# Patient Record
Sex: Female | Born: 1995 | Race: White | Hispanic: No | Marital: Single | Smoking: Never smoker
Health system: Southern US, Community
[De-identification: ages and names within clinical notes are randomized; demographics above are authoritative.]

---
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
## 2017-12-08 IMAGING — CR DG ANKLE COMPLETE 3+V*L*
1 series · 3 of 3 positions shown · non-contrast
Comparison: None in PACs

CLINICAL DATA: Status post fall down stairs 2 months ago with
persistent lateral malleolar discomfort ; no previous injuries.

EXAM:
LEFT ANKLE COMPLETE - 3+ VIEW

[Series 1: dg ankle complete left · 0.14mm/px · 3 of 3 slices shown]
[im 1/3]
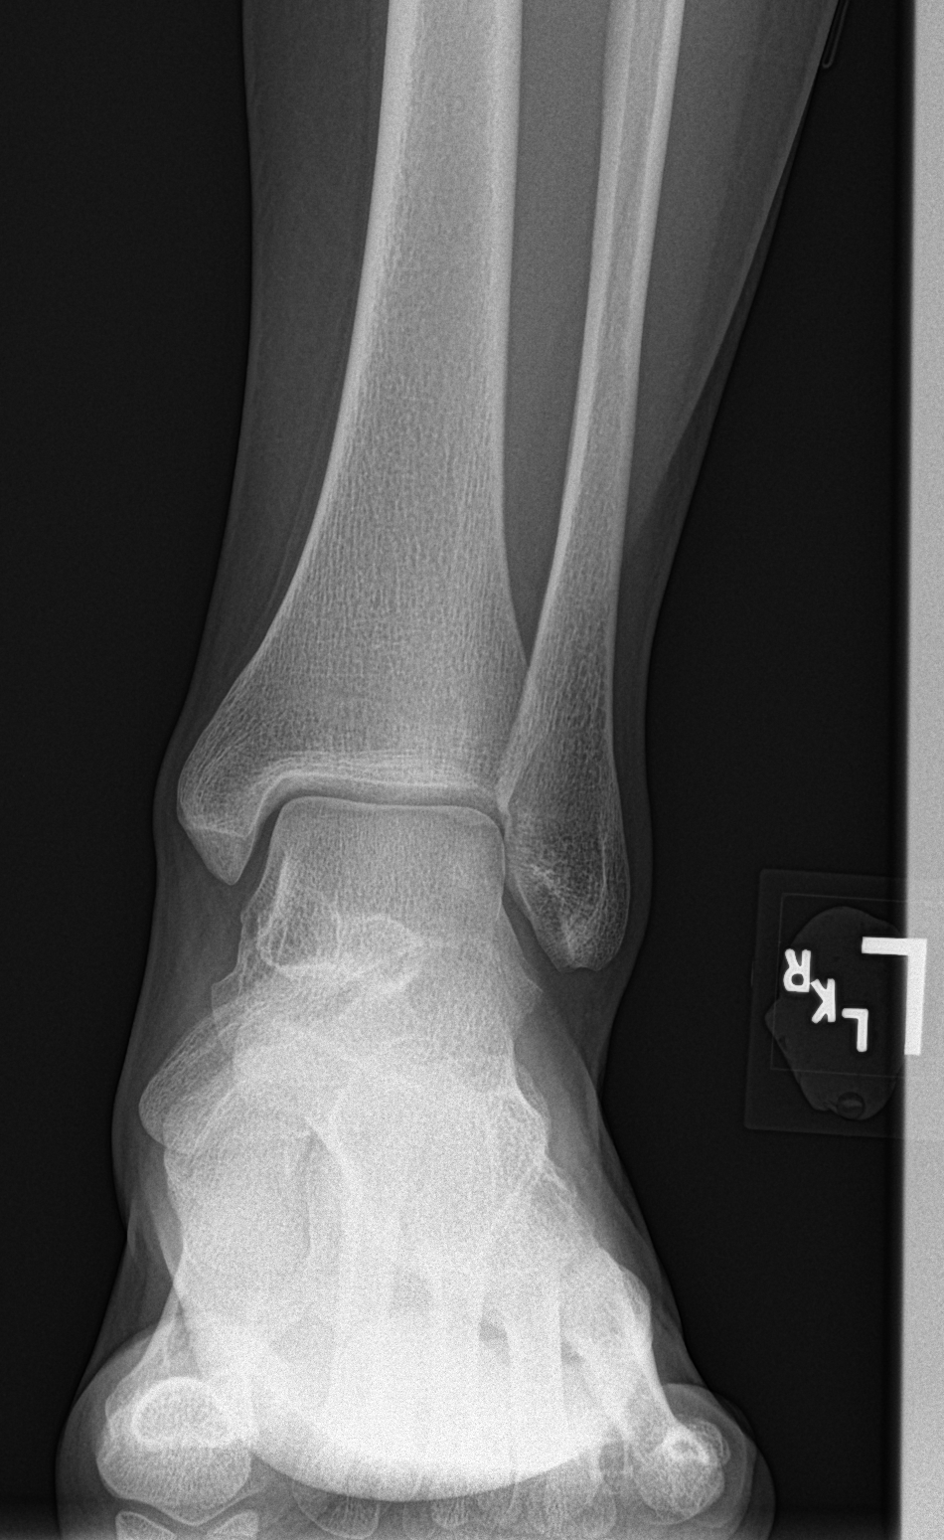
[im 2/3]
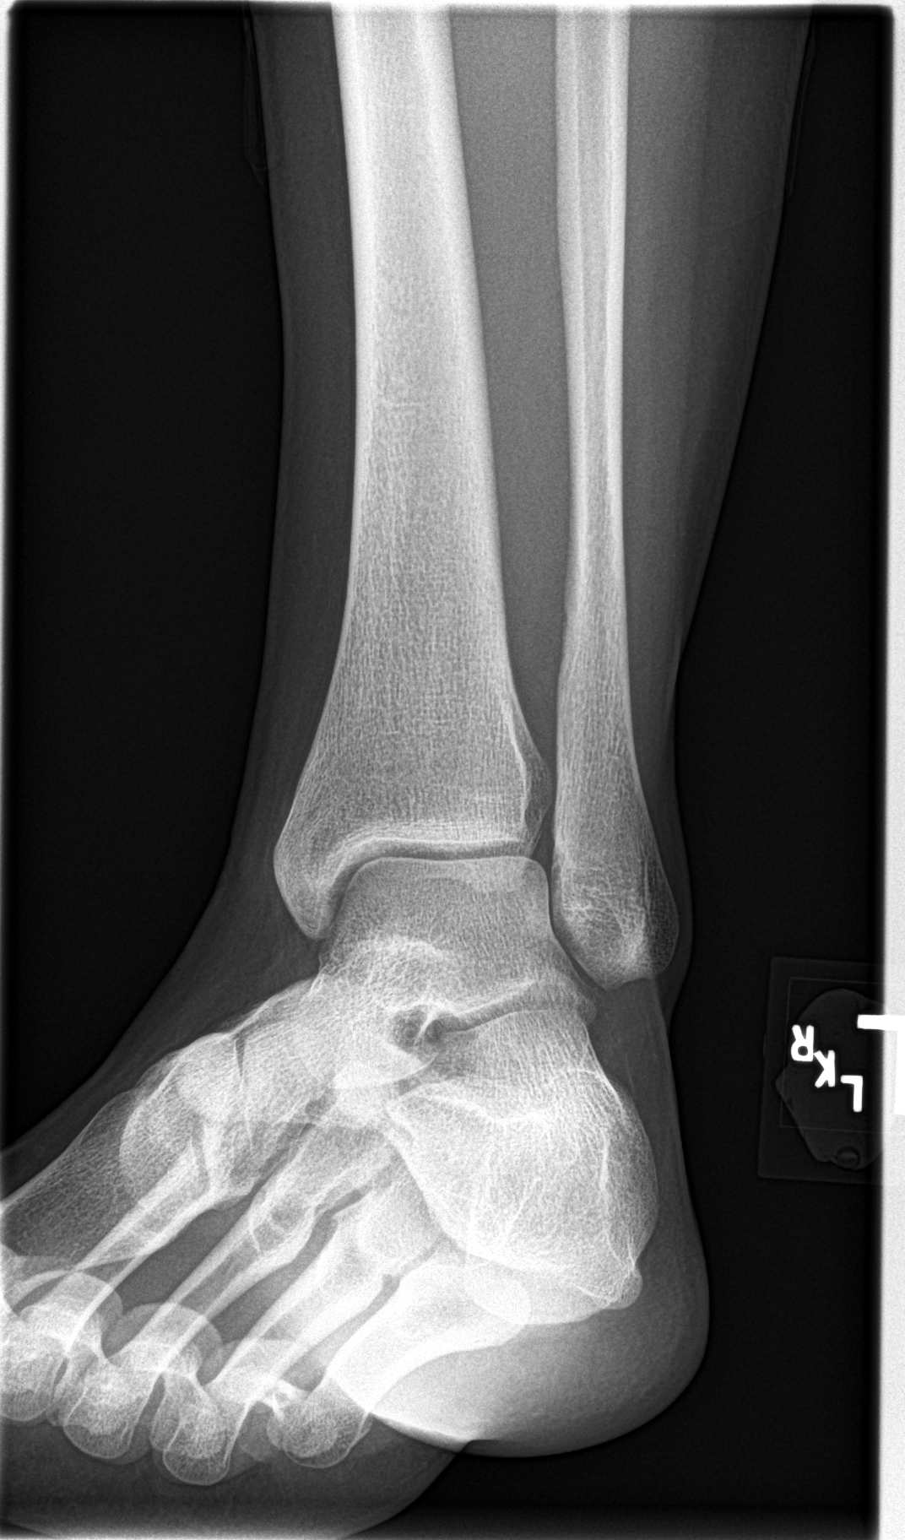
[im 3/3]
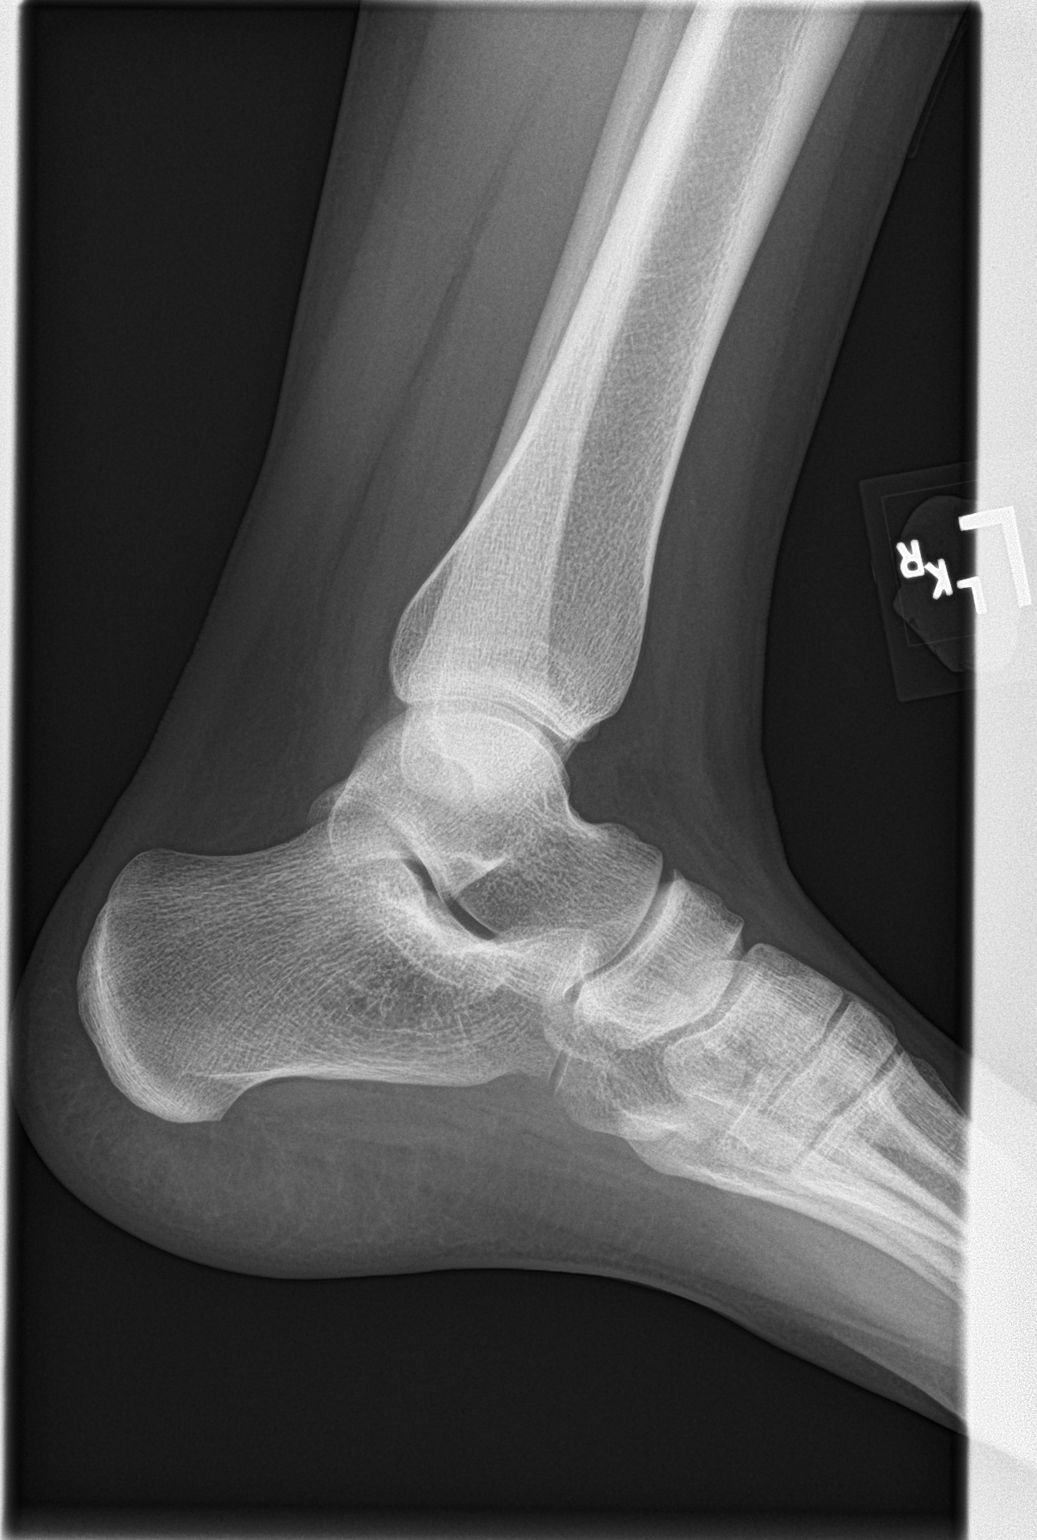

[3 of 3 positions shown; findings below may reference images not displayed]

FINDINGS: The bones of the ankle are adequately mineralized. There is no acute
or old fracture nor evidence of dislocation. There is no significant
degenerative change. The talar dome is intact. The talus and
calcaneus exhibit no acute abnormalities. The soft tissues are
unremarkable.
IMPRESSION: There is no acute or chronic bony abnormality of the left ankle. If
the patient is having significant persistent symptoms, further
evaluation with MRI would be useful to assess the patient for soft
tissue injury.

## 2018-12-21 ENCOUNTER — Other Ambulatory Visit: Payer: Self-pay | Admitting: Unknown Physician Specialty

## 2018-12-21 DIAGNOSIS — R221 Localized swelling, mass and lump, neck: Secondary | ICD-10-CM

## 2018-12-26 ENCOUNTER — Ambulatory Visit
Admission: RE | Admit: 2018-12-26 | Discharge: 2018-12-26 | Disposition: A | Discharge status: Home / Self Care | Payer: BC Managed Care – PPO | Source: Ambulatory Visit | Attending: Unknown Physician Specialty | Admitting: Unknown Physician Specialty

## 2018-12-26 ENCOUNTER — Other Ambulatory Visit: Payer: Self-pay

## 2018-12-26 DIAGNOSIS — R221 Localized swelling, mass and lump, neck: Secondary | ICD-10-CM

## 2019-03-13 ENCOUNTER — Ambulatory Visit: Payer: BC Managed Care – PPO | Admitting: Family Medicine

## 2019-03-13 ENCOUNTER — Encounter: Payer: Self-pay | Admitting: Family Medicine

## 2019-03-13 ENCOUNTER — Other Ambulatory Visit: Payer: Self-pay

## 2019-03-13 VITALS — BP 121/78 | HR 83 | Temp 97.8°F | Resp 12 | Ht 67.5 in | Wt 139.2 lb

## 2019-03-13 DIAGNOSIS — M542 Cervicalgia: Secondary | ICD-10-CM

## 2019-03-13 DIAGNOSIS — F5104 Psychophysiologic insomnia: Secondary | ICD-10-CM | POA: Diagnosis not present

## 2019-03-13 DIAGNOSIS — E039 Hypothyroidism, unspecified: Secondary | ICD-10-CM

## 2019-03-13 DIAGNOSIS — F411 Generalized anxiety disorder: Secondary | ICD-10-CM | POA: Diagnosis not present

## 2019-03-13 DIAGNOSIS — E038 Other specified hypothyroidism: Secondary | ICD-10-CM | POA: Insufficient documentation

## 2019-03-13 MED ORDER — TRAZODONE HCL 50 MG PO TABS: 25.0000 mg | ORAL_TABLET | Freq: Every evening | ORAL | 1 refills | Status: DC | PRN

## 2019-03-13 MED ORDER — FLUOXETINE HCL 20 MG PO TABS: 20.0000 mg | ORAL_TABLET | Freq: Every day | ORAL | 1 refills | Status: DC

## 2019-03-13 NOTE — Assessment & Plan Note (Signed)
Initial mildly abnormal labs in 11/2018 with normal on repeat in 01/2019. Saw endocrinology and not started on medication. Discussed that based on these labs she at one point in time met the diagnosis for subclinical hypothyroidism, however, medication is not always indicated or helpful. Given normal follow-up discussed that we could check in 3 months if symptoms (hairloss, weight gain, sluggishness) do not improve with other treatment

## 2019-03-13 NOTE — Assessment & Plan Note (Signed)
Reviewed recent normal [place]. Pt already seen ENT. Discussed trial of PT to see if symptoms improve. Referral placed

## 2019-03-13 NOTE — Assessment & Plan Note (Signed)
Discussed starting SSRI will start fluoxetine and increase. Pt does not have time to do therapy at this time. Advised mindfulness practice

## 2019-03-13 NOTE — Assessment & Plan Note (Signed)
Chronic symptoms, which have some anxiety component. Treat anxiety and then can start trazodone. Discussed risk of serotonin syndrome and what to look for with combination medication.

## 2019-03-13 NOTE — Progress Notes (Signed)
Subjective:     Gabrielle Cantrell is a 24 y.o. female presenting for Establish Care (previous PCP Dr. Gregary Signs), Neck Pain (right side x 6 months), and Anxiety     HPI  #Neck pain - has been to 3 doctors since she has been home - [place] normal - has a bump on one side of her neck which is not on the other side - sore and some pain - saw something - told it was a muscle by ENT Dr. Tami Ribas - just feels swollen and goes up to the ear and only on that side of the neck - and get some neck pain   Question about thyroid disorder Has had blood work done too Surveyor, minerals to an endocrinologist - and told that nothing was wrong Now her numbers are low  #Joint pain - has had several different joint issues - also had Raynauds phenomenon - was tested for lupus and autoimmune  - saw a specialist - in 2016 at [facility] - knee would turn red, and couldn't bend/straighten leg - difficulty finding a reason for the symptoms  Heart issues last December and wore a monitor - did have some tachycardia  #anxiety/insomnia - mom notes worse with pandemic - in master's program - not sleeping well - not worrying  - takes nyquil when she wants to sleep  - tried: melatonin and it stopped work, liquid sleep aid w/o help - symptoms since high school - stresses about everything - has always been a worrier worse over the last 3 months - little caffeine - will not fall asleep until 3-4 am - but can still function   Review of Systems  Constitutional: Positive for fatigue and unexpected weight change (weight).  Endocrine: Positive for cold intolerance.  Genitourinary:       Abnormal menses  Musculoskeletal: Positive for myalgias.       Joint pain  Psychiatric/Behavioral: Positive for sleep disturbance.     Social History   Tobacco Use  Smoking Status Never Smoker  Smokeless Tobacco Never Used        Objective:    BP Readings from Last 3 Encounters:  03/13/19 121/78   Wt Readings from Last 3  Encounters:  03/13/19 139 lb 4 oz (63.2 kg)    BP 121/78   Pulse 83   Temp 97.8 F (36.6 C)   Resp 12   Ht 5' 7.5" (1.715 m)   Wt 139 lb 4 oz (63.2 kg)   LMP 03/08/2019   SpO2 97%   BMI 21.49 kg/m    Physical Exam Constitutional:      General: She is not in acute distress.    Appearance: She is well-developed. She is not diaphoretic.  HENT:     Right Ear: External ear normal.     Left Ear: External ear normal.     Nose: Nose normal.  Eyes:     Conjunctiva/sclera: Conjunctivae normal.  Neck:     Comments: Mild fullness of the Sternocleidomastoid of the right>left Cardiovascular:     Rate and Rhythm: Normal rate.  Pulmonary:     Effort: Pulmonary effort is normal.  Musculoskeletal:     Cervical back: Normal range of motion and neck supple. No rigidity or tenderness. No pain with movement, spinous process tenderness or muscular tenderness.  Skin:    General: Skin is warm and dry.     Capillary Refill: Capillary refill takes less than 2 seconds.  Neurological:     Mental Status:  She is alert. Mental status is at baseline.  Psychiatric:        Mood and Affect: Mood is anxious. Affect is tearful.        Behavior: Behavior normal.    GAD 7 : Generalized Anxiety Score 03/13/2019  Nervous, Anxious, on Edge 3  Control/stop worrying 3  Worry too much - different things 3  Trouble relaxing 1  Restless 0  Easily annoyed or irritable 1  Afraid - awful might happen 3  Total GAD 7 Score 14  Anxiety Difficulty Somewhat difficult    Depression screen PHQ 2/9 03/13/2019  Decreased Interest 2  Down, Depressed, Hopeless 2  PHQ - 2 Score 4  Altered sleeping 3  Tired, decreased energy 3  Change in appetite 1  Feeling bad or failure about yourself  0  Trouble concentrating 0  Moving slowly or fidgety/restless 0  Suicidal thoughts 0  PHQ-9 Score 11  Difficult doing work/chores Somewhat difficult           Assessment & Plan:   Problem List Items Addressed This Visit       Endocrine   Subclinical hypothyroidism    Initial mildly abnormal labs in 11/2018 with normal on repeat in 01/2019. Saw endocrinology and not started on medication. Discussed that based on these labs she at one point in time met the diagnosis for subclinical hypothyroidism, however, medication is not always indicated or helpful. Given normal follow-up discussed that we could check in 3 months if symptoms (hairloss, weight gain, sluggishness) do not improve with other treatment        Other   Neck pain    Reviewed recent normal [place]. Pt already seen ENT. Discussed trial of PT to see if symptoms improve. Referral placed      Relevant Orders   Ambulatory referral to Physical Therapy   Generalized anxiety disorder - Primary    Discussed starting SSRI will start fluoxetine and increase. Pt does not have time to do therapy at this time. Advised mindfulness practice      Relevant Medications   FLUoxetine (PROZAC) 20 MG tablet   traZODone (DESYREL) 50 MG tablet   Chronic insomnia    Chronic symptoms, which have some anxiety component. Treat anxiety and then can start trazodone. Discussed risk of serotonin syndrome and what to look for with combination medication.         Due to time did not address intermittent joint symptoms. Quick review of chart with normal ANA in 2016. Will discuss further at future appointments  Return in about 4 weeks (around 04/10/2019).  Lesleigh Noe, MD

## 2019-03-13 NOTE — Patient Instructions (Addendum)
Take 1/2 tablet of Fluoxetine x 1 week Then increase to full tablet  Ok to start the Trazodone after 2 weeks - monitor for side effects  How to help anxiety - without medication.   1) Regular Exercise - walking, jogging, cycling, dancing, strength training --> Yoga has been shown in research to reduce depression and anxiety -- with even just one hour long session per week  2)  Begin a Mindfulness/Meditation practice -- this can take a little as 3 minutes and is helpful for all kinds of mood issues -- You can find resources in books -- Or you can download apps like  ---- Headspace App (which currently has free content called "Weathering the Storm") ---- Calm (which has a few free options)  ---- Insignt Timer ---- Stop, Breathe & Think  # With each of these Apps - you should decline the "start free trial" offer and as you search through the App should be able to access some of their free content. You can also chose to pay for the content if you find one that works well for you.   # Many of them also offer sleep specific content which may help with insomnia  3) Healthy Diet -- Avoid or decrease Caffeine -- Avoid or decrease Alcohol -- Drink plenty of water, have a balanced diet -- Avoid cigarettes and marijuana (as well as other recreational drugs)  4) Consider contacting a professional therapist  -- [facility] is one option. Call [phone] -- Or you can check out www.psychologytoday.com -- you can read bios of therapists and see if they accept insurance -- Check with your insurance to see if you have coverage and who may take your insurance   Seratonin Mental status changes can include anxiety, agitated delirium, restlessness, and disorientation [13]. Patients may startle easily. Autonomic manifestations can include diaphoresis, tachycardia, hyperthermia, hypertension, vomiting, and diarrhea [3]. Neuromuscular hyperactivity can manifest as tremor, myoclonus,  hyperreflexia, and bilateral Babinski sign. Hyperreflexia and clonus are particularly common; these findings, as well as rigidity, are more often pronounced in the lower extremities [3]. In severe cases, muscle rigidity may mask myoclonus and hyperreflexia.

## 2019-04-09 ENCOUNTER — Encounter: Payer: Self-pay | Admitting: Family Medicine

## 2019-08-09 ENCOUNTER — Encounter: Payer: Self-pay | Admitting: Family Medicine

## 2019-08-15 ENCOUNTER — Ambulatory Visit (INDEPENDENT_AMBULATORY_CARE_PROVIDER_SITE_OTHER): Payer: BC Managed Care – PPO | Admitting: *Deleted

## 2019-08-15 DIAGNOSIS — Z111 Encounter for screening for respiratory tuberculosis: Secondary | ICD-10-CM

## 2019-08-15 NOTE — Progress Notes (Signed)
Per orders of Dr. Selena Batten, TB skin test was placed by Shon Millet. Patient tolerated injection well.

## 2019-08-17 ENCOUNTER — Encounter: Payer: Self-pay | Admitting: Family Medicine

## 2019-08-17 ENCOUNTER — Ambulatory Visit: Payer: BC Managed Care – PPO | Admitting: Family Medicine

## 2019-08-17 ENCOUNTER — Other Ambulatory Visit: Payer: Self-pay

## 2019-08-17 VITALS — BP 122/78 | HR 80 | Temp 97.8°F | Wt 132.2 lb

## 2019-08-17 DIAGNOSIS — Z789 Other specified health status: Secondary | ICD-10-CM | POA: Diagnosis not present

## 2019-08-17 DIAGNOSIS — F411 Generalized anxiety disorder: Secondary | ICD-10-CM

## 2019-08-17 DIAGNOSIS — F5104 Psychophysiologic insomnia: Secondary | ICD-10-CM

## 2019-08-17 LAB — TB SKIN TEST
Induration: 0 mm
TB Skin Test: NEGATIVE

## 2019-08-17 MED ORDER — DROSPIRENONE-ETHINYL ESTRADIOL 3-0.03 MG PO TABS: 1.0000 | ORAL_TABLET | Freq: Every day | ORAL | 3 refills | Status: DC

## 2019-08-17 NOTE — Assessment & Plan Note (Signed)
Improved with trazodone, though not using currently as on summer vacation. Anticipates she may need more trazodone in the fall.

## 2019-08-17 NOTE — Patient Instructions (Signed)
Return in the fall for an annual exam

## 2019-08-17 NOTE — Assessment & Plan Note (Signed)
Resolved with completing graduate school and less stress. Cont to monitor.

## 2019-08-17 NOTE — Progress Notes (Signed)
   Subjective:     Gabrielle Cantrell is a 24 y.o. female presenting for Follow-up (FILL OUT FORM)     HPI  #Anxiety/sleep - took the fluoxetine for 2 weeks  - started to have abnormal symptoms  - then only took trazodone - which helped  - stopped recently with the summer - because she feels tired - melatonin does   Moving to [place] area - will eventually find a provider there  Teaching high school special ed in [place]  Review of Systems  03/13/2019: Clinic - anxiety/sleep - trazodone/fluoxetine 04/09/2019: Mychart - was planning to take just the one for sleep  Social History   Tobacco Use  Smoking Status Never Smoker  Smokeless Tobacco Never Used        Objective:    BP Readings from Last 3 Encounters:  08/17/19 122/78  03/13/19 121/78   Wt Readings from Last 3 Encounters:  08/17/19 132 lb 4 oz (60 kg)  03/13/19 139 lb 4 oz (63.2 kg)    BP 122/78   Pulse 80   Temp 97.8 F (36.6 C) (Temporal)   Wt 132 lb 4 oz (60 kg)   SpO2 99%   BMI 20.41 kg/m    Physical Exam Constitutional:      General: She is not in acute distress.    Appearance: She is well-developed. She is not diaphoretic.  HENT:     Right Ear: External ear normal.     Left Ear: External ear normal.     Nose: Nose normal.  Eyes:     Conjunctiva/sclera: Conjunctivae normal.  Cardiovascular:     Rate and Rhythm: Normal rate and regular rhythm.     Heart sounds: No murmur heard.   Pulmonary:     Effort: Pulmonary effort is normal. No respiratory distress.     Breath sounds: Normal breath sounds. No wheezing.  Musculoskeletal:     Cervical back: Neck supple.  Skin:    General: Skin is warm and dry.     Capillary Refill: Capillary refill takes less than 2 seconds.  Neurological:     Mental Status: She is alert. Mental status is at baseline.  Psychiatric:        Mood and Affect: Mood normal.        Behavior: Behavior normal.           Assessment & Plan:   Problem List Items  Addressed This Visit      Other   Generalized anxiety disorder    Resolved with completing graduate school and less stress. Cont to monitor.       Chronic insomnia    Improved with trazodone, though not using currently as on summer vacation. Anticipates she may need more trazodone in the fall.        Other Visit Diagnoses    Uses birth control    -  Primary   Relevant Medications   drospirenone-ethinyl estradiol (YASMIN) 3-0.03 MG tablet       Return in about 3 months (around 11/17/2019) for annual exam.  Lynnda Child, MD  This visit occurred during the SARS-CoV-2 public health emergency.  Safety protocols were in place, including screening questions prior to the visit, additional usage of staff PPE, and extensive cleaning of exam room while observing appropriate contact time as indicated for disinfecting solutions.

## 2019-09-11 ENCOUNTER — Ambulatory Visit: Payer: BC Managed Care – PPO | Admitting: Family Medicine

## 2019-09-11 ENCOUNTER — Other Ambulatory Visit: Payer: Self-pay

## 2019-09-11 ENCOUNTER — Encounter: Payer: Self-pay | Admitting: Family Medicine

## 2019-09-11 VITALS — BP 100/60 | HR 100 | Temp 97.6°F | Wt 133.5 lb

## 2019-09-11 DIAGNOSIS — R152 Fecal urgency: Secondary | ICD-10-CM

## 2019-09-11 DIAGNOSIS — Z91038 Other insect allergy status: Secondary | ICD-10-CM

## 2019-09-11 DIAGNOSIS — K529 Noninfective gastroenteritis and colitis, unspecified: Secondary | ICD-10-CM | POA: Diagnosis not present

## 2019-09-11 LAB — UNLABELED: Test Ordered On Req: 58319

## 2019-09-11 NOTE — Patient Instructions (Addendum)
Insect Bites - make sure to wear mosquito repellant or long sleeves/pants when outside - Can use topical benadryl for itching relief - If severe can use Benadryl or daily allergy pill (zyrtec)  Wait for the results  But suspect Irritable Bowel Syndrome with diarrhea  Treatment option -- Low FODMAP diet -- loperamide 2 mg 45 minutes before a meal

## 2019-09-11 NOTE — Progress Notes (Signed)
Subjective:     Gabrielle Cantrell is a 24 y.o. female presenting for Diarrhea (with certain foods for awhile, but has gotten worse. ) and Insect Bite (gets large welps with mosquito bites )     HPI  #Insect bites - on certain locations - arm and thighs - will get large welps on the skin - one bite will have local inflammation that will take up half her leg - in the past did end up with antibiotic  - treatment: anti-itch cream and cortisone cream  #Diarrhea - after dinner typically - will sometimes occur after lunch - will get stomach cramps and immediately go to the bathroom - will happen after eating - but will not consistently happen after eating - symptoms started 1 year ago - no blood in the stool - getting worse recently - did stay up all night last week with diarrhea and stomach cramping (thinks she ate chicken, roll, veggies) - has not found one specific trigger for her symptoms - Typically: will have cramping - loose stool - and then after has mild cramps but no additional stools -- symptoms occurring most nights  - did try a food diary w/ any help in finding triggers  Review of Systems   Social History   Tobacco Use  Smoking Status Never Smoker  Smokeless Tobacco Never Used        Objective:    BP Readings from Last 3 Encounters:  09/11/19 100/60  08/17/19 122/78  03/13/19 121/78   Wt Readings from Last 3 Encounters:  09/11/19 133 lb 8 oz (60.6 kg)  08/17/19 132 lb 4 oz (60 kg)  03/13/19 139 lb 4 oz (63.2 kg)    BP 100/60   Pulse 100   Temp 97.6 F (36.4 C) (Temporal)   Wt 133 lb 8 oz (60.6 kg)   LMP 09/06/2019 (Exact Date)   SpO2 97%   BMI 20.60 kg/m    Physical Exam Constitutional:      General: She is not in acute distress.    Appearance: She is well-developed. She is not diaphoretic.  HENT:     Right Ear: External ear normal.     Left Ear: External ear normal.  Eyes:     Conjunctiva/sclera: Conjunctivae normal.  Cardiovascular:      Rate and Rhythm: Normal rate and regular rhythm.     Heart sounds: No murmur heard.   Pulmonary:     Effort: Pulmonary effort is normal. No respiratory distress.     Breath sounds: Normal breath sounds. No wheezing.  Abdominal:     General: Abdomen is flat. Bowel sounds are normal. There is no distension.     Palpations: Abdomen is soft.     Tenderness: There is no abdominal tenderness. There is no guarding or rebound.  Musculoskeletal:     Cervical back: Neck supple.  Skin:    General: Skin is warm and dry.     Capillary Refill: Capillary refill takes less than 2 seconds.     Comments: Has photographs of prior insect bites with large area of erythema covering 1/2 of her thigh  Neurological:     Mental Status: She is alert. Mental status is at baseline.  Psychiatric:        Mood and Affect: Mood normal.        Behavior: Behavior normal.           Assessment & Plan:   Problem List Items Addressed This Visit  Digestive   Chronic diarrhea - Primary    Does not have red flag symptoms but does have urgency. Will rule out causes but discussed possible IBS. If work-up negative, discussed trial of imodium and FODMAP diet.       Relevant Orders   Celiac Pnl 2 rflx Endomysial Ab Ttr   Calprotectin, Fecal   Gastrointestinal Pathogen Panel PCR   Giardia/cryptosporidium (EIA)     Other   Allergic reaction to insect bite    Seems to mount robust allergic reaction to insect bites based on photos. Advised avoidance and repellant as well as allergy medication if reaction. If severe, no improvement - make appointment to be seen. No acute lesions today       Other Visit Diagnoses    Fecal urgency       Relevant Orders   Celiac Pnl 2 rflx Endomysial Ab Ttr   Calprotectin, Fecal   Gastrointestinal Pathogen Panel PCR   Giardia/cryptosporidium (EIA)       Return if symptoms worsen or fail to improve.  Lynnda Child, MD  This visit occurred during the SARS-CoV-2 public  health emergency.  Safety protocols were in place, including screening questions prior to the visit, additional usage of staff PPE, and extensive cleaning of exam room while observing appropriate contact time as indicated for disinfecting solutions.

## 2019-09-11 NOTE — Assessment & Plan Note (Signed)
Does not have red flag symptoms but does have urgency. Will rule out causes but discussed possible IBS. If work-up negative, discussed trial of imodium and FODMAP diet.

## 2019-09-11 NOTE — Assessment & Plan Note (Signed)
Seems to mount robust allergic reaction to insect bites based on photos. Advised avoidance and repellant as well as allergy medication if reaction. If severe, no improvement - make appointment to be seen. No acute lesions today

## 2019-09-14 ENCOUNTER — Other Ambulatory Visit: Payer: BC Managed Care – PPO

## 2019-09-14 DIAGNOSIS — K529 Noninfective gastroenteritis and colitis, unspecified: Secondary | ICD-10-CM

## 2019-09-14 DIAGNOSIS — R152 Fecal urgency: Secondary | ICD-10-CM

## 2019-09-17 LAB — CALPROTECTIN, FECAL: Calprotectin, Fecal: <16 ug/g (ref 0–120)

## 2019-09-21 LAB — GASTROINTESTINAL PATHOGEN PANEL PCR
C. difficile Tox A/B, PCR: NOT DETECTED
Campylobacter, PCR: NOT DETECTED
Cryptosporidium, PCR: NOT DETECTED
E coli (ETEC) LT/ST PCR: NOT DETECTED
E coli (STEC) stx1/stx2, PCR: NOT DETECTED
E coli 0157, PCR: NOT DETECTED
Giardia lamblia, PCR: NOT DETECTED
Norovirus, PCR: NOT DETECTED
Rotavirus A, PCR: NOT DETECTED
Salmonella, PCR: NOT DETECTED
Shigella, PCR: NOT DETECTED

## 2019-09-21 LAB — GIARDIA/CRYPTOSPORIDIUM (EIA)
MICRO NUMBER:: 10738277
MICRO NUMBER:: 10738278
RESULT:: NOT DETECTED
RESULT:: NOT DETECTED
SPECIMEN QUALITY:: ADEQUATE
SPECIMEN QUALITY:: ADEQUATE

## 2020-07-16 ENCOUNTER — Other Ambulatory Visit: Payer: Self-pay | Admitting: Family Medicine

## 2020-07-16 DIAGNOSIS — Z789 Other specified health status: Secondary | ICD-10-CM

## 2020-07-19 NOTE — Telephone Encounter (Signed)
Gabrielle Cantrell called in wanted to know about her [facility] and related the message that she needs a CPE got her set for 6/21 @1000 

## 2020-07-24 ENCOUNTER — Telehealth: Payer: Self-pay | Admitting: Family Medicine

## 2020-07-24 DIAGNOSIS — Z789 Other specified health status: Secondary | ICD-10-CM

## 2020-07-24 MED ORDER — DROSPIRENONE-ETHINYL ESTRADIOL 3-0.03 MG PO TABS: 1.0000 | ORAL_TABLET | Freq: Every day | ORAL | 0 refills | Status: DC

## 2020-07-24 NOTE — Telephone Encounter (Signed)
Left VM (DPR), telling pt that we haven't sent in a refill for her Gabrielle Cantrell [facility] yet and that we will send one in after her appt on 08/13/20. Pt should have enough medication to get her through the month.

## 2020-07-24 NOTE — Addendum Note (Signed)
Addended by: Erby Pian on: 07/24/2020 04:33 PM   Modules accepted: Orders

## 2020-07-24 NOTE — Telephone Encounter (Signed)
Pt calling in stating that the pharmacy needs a PA for pt birthcontrol.  [id number] - [place],  - [address] AT [place] OF [facility]

## 2020-07-24 NOTE — Telephone Encounter (Signed)
Pt states that she does not have enough medication until 6/21. Due to it being denied it needs to have a PA

## 2020-08-13 ENCOUNTER — Other Ambulatory Visit: Payer: Self-pay

## 2020-08-13 ENCOUNTER — Encounter: Payer: Self-pay | Admitting: Family Medicine

## 2020-08-13 ENCOUNTER — Ambulatory Visit (INDEPENDENT_AMBULATORY_CARE_PROVIDER_SITE_OTHER): Payer: BC Managed Care – PPO | Admitting: Family Medicine

## 2020-08-13 VITALS — BP 90/72 | HR 82 | Temp 97.4°F | Ht 67.75 in | Wt 132.5 lb

## 2020-08-13 DIAGNOSIS — Z8349 Family history of other endocrine, nutritional and metabolic diseases: Secondary | ICD-10-CM

## 2020-08-13 DIAGNOSIS — Z0000 Encounter for general adult medical examination without abnormal findings: Secondary | ICD-10-CM

## 2020-08-13 DIAGNOSIS — Z114 Encounter for screening for human immunodeficiency virus [HIV]: Secondary | ICD-10-CM

## 2020-08-13 DIAGNOSIS — L309 Dermatitis, unspecified: Secondary | ICD-10-CM | POA: Insufficient documentation

## 2020-08-13 DIAGNOSIS — Z789 Other specified health status: Secondary | ICD-10-CM

## 2020-08-13 DIAGNOSIS — Z136 Encounter for screening for cardiovascular disorders: Secondary | ICD-10-CM

## 2020-08-13 DIAGNOSIS — Z1159 Encounter for screening for other viral diseases: Secondary | ICD-10-CM

## 2020-08-13 LAB — LIPID PANEL
Cholesterol: 213 mg/dL — ABNORMAL HIGH (ref 0–200)
HDL: 101.4 mg/dL (ref >39.00)
LDL Cholesterol: 78 mg/dL (ref 0–99)
NonHDL: 111.91
Total CHOL/HDL Ratio: 2
Triglycerides: 168 mg/dL — ABNORMAL HIGH (ref 0.0–149.0)
VLDL: 33.6 mg/dL (ref 0.0–40.0)

## 2020-08-13 LAB — TSH: TSH: 3.32 u[IU]/mL (ref 0.35–4.50)

## 2020-08-13 MED ORDER — DROSPIRENONE-ETHINYL ESTRADIOL 3-0.03 MG PO TABS: 1.0000 | ORAL_TABLET | Freq: Every day | ORAL | 3 refills | Status: DC

## 2020-08-13 MED ORDER — TRIAMCINOLONE ACETONIDE 0.025 % EX CREA: 1.0000 "application " | TOPICAL_CREAM | Freq: Every day | CUTANEOUS | 1 refills | Status: DC | PRN

## 2020-08-13 NOTE — Patient Instructions (Signed)
Preventive Care 21-24 Years Old, Female Preventive care refers to lifestyle choices and visits with your health care provider that can promote health and wellness. This includes: A yearly physical exam. This is also called an annual wellness visit. Regular dental and eye exams. Immunizations. Screening for certain conditions. Healthy lifestyle choices, such as: Eating a healthy diet. Getting regular exercise. Not using drugs or products that contain nicotine and tobacco. Limiting alcohol use. What can I expect for my preventive care visit? Physical exam Your health care provider may check your: Height and weight. These may be used to calculate your BMI (body mass index). BMI is a measurement that tells if you are at a healthy weight. Heart rate and blood pressure. Body temperature. Skin for abnormal spots. Counseling Your health care provider may ask you questions about your: Past medical problems. Family's medical history. Alcohol, tobacco, and drug use. Emotional well-being. Home life and relationship well-being. Sexual activity. Diet, exercise, and sleep habits. Work and work environment. Access to firearms. Method of birth control. Menstrual cycle. Pregnancy history. What immunizations do I need?  Vaccines are usually given at various ages, according to a schedule. Your health care provider will recommend vaccines for you based on your age, medicalhistory, and lifestyle or other factors, such as travel or where you work. What tests do I need?  Blood tests Lipid and cholesterol levels. These may be checked every 5 years starting at age 20. Hepatitis C test. Hepatitis B test. Screening Diabetes screening. This is done by checking your blood sugar (glucose) after you have not eaten for a while (fasting). STD (sexually transmitted disease) testing, if you are at risk. BRCA-related cancer screening. This may be done if you have a family history of breast, ovarian, tubal, or  peritoneal cancers. Pelvic exam and Pap test. This may be done every 3 years starting at age 21. Starting at age 30, this may be done every 5 years if you have a Pap test in combination with an HPV test. Talk with your health care provider about your test results, treatment options,and if necessary, the need for more tests. Follow these instructions at home: Eating and drinking  Eat a healthy diet that includes fresh fruits and vegetables, whole grains, lean protein, and low-fat dairy products. Take vitamin and mineral supplements as recommended by your health care provider. Do not drink alcohol if: Your health care provider tells you not to drink. You are pregnant, may be pregnant, or are planning to become pregnant. If you drink alcohol: Limit how much you have to 0-1 drink a day. Be aware of how much alcohol is in your drink. In the [place] one drink equals one 12 oz bottle of beer (355 mL), one 5 oz glass of wine (148 mL), or one 1 oz glass of hard liquor (44 mL).  Lifestyle Take daily care of your teeth and gums. Brush your teeth every morning and night with fluoride toothpaste. Floss one time each day. Stay active. Exercise for at least 30 minutes 5 or more days each week. Do not use any products that contain nicotine or tobacco, such as cigarettes, e-cigarettes, and chewing tobacco. If you need help quitting, ask your health care provider. Do not use drugs. If you are sexually active, practice safe sex. Use a condom or other form of protection to prevent STIs (sexually transmitted infections). If you do not wish to become pregnant, use a form of birth control. If you plan to become pregnant, see your health care   provider for a prepregnancy visit. Find healthy ways to cope with stress, such as: Meditation, yoga, or listening to music. Journaling. Talking to a trusted person. Spending time with friends and family. Safety Always wear your seat belt while driving or riding in a  vehicle. Do not drive: If you have been drinking alcohol. Do not ride with someone who has been drinking. When you are tired or distracted. While texting. Wear a helmet and other protective equipment during sports activities. If you have firearms in your house, make sure you follow all gun safety procedures. Seek help if you have been physically or sexually abused. What's next? Go to your health care provider once a year for an annual wellness visit. Ask your health care provider how often you should have your eyes and teeth checked. Stay up to date on all vaccines. This information is not intended to replace advice given to you by your health care provider. Make sure you discuss any questions you have with your healthcare provider. Document Revised: 10/08/2019 Document Reviewed: 10/21/2017 Elsevier Patient Education  2022 [employer].

## 2020-08-13 NOTE — Progress Notes (Signed)
Annual Exam   Chief Complaint:  Chief Complaint  Patient presents with   Annual Exam    History of Present Illness:  Ms. Gabrielle Cantrell is a 25 y.o. No obstetric history on file. who LMP was Patient's last menstrual period was 07/24/2020., presents today for her annual examination.      Nutrition/Lifestyle Diet: pretty good Exercise: walking daily  She does get adequate calcium and Vitamin D in her diet.  Social History   Tobacco Use  Smoking Status Never  Smokeless Tobacco Never   Social History   Substance and Sexual Activity  Alcohol Use Yes   Comment: on the weekends   Social History   Substance and Sexual Activity  Drug Use Never     Safety The patient wears seatbelts: yes.     The patient feels safe at home and in their relationships: yes.  General Health Dentist in the last year: Yes Eye doctor: not applicable  Menstrual Her menses are regular every 28-30 days, no concerns no breakthrough bleeding  GYN She is not sexually active.     Cervical Cancer Screening (Age 32-65) Working to find an OB/GYN  Family History of Breast Cancer: no Family History of Ovarian Cancer: no    Weight Wt Readings from Last 3 Encounters:  08/13/20 132 lb 8 oz (60.1 kg)  09/11/19 133 lb 8 oz (60.6 kg)  08/17/19 132 lb 4 oz (60 kg)   Patient has normal BMI  BMI Readings from Last 1 Encounters:  08/13/20 20.30 kg/m     Chronic disease screening Blood pressure monitoring:  BP Readings from Last 3 Encounters:  08/13/20 90/72  09/11/19 100/60  08/17/19 122/78     Lipid Monitoring: Indication for screening: age >51, obesity, diabetes, family hx, CV risk factors.  Lipid screening: Yes  No results found for: CHOL, HDL, LDLCALC, LDLDIRECT, TRIG, CHOLHDL   Diabetes Screening: age >75, overweight, family hx, PCOS, hx of gestational diabetes, at risk ethnicity, elevated blood pressure >135/80.  Diabetes Screening screening: Not Indicated  No results found  for: HGBA1C    History reviewed. No pertinent past medical history.  History reviewed. No pertinent surgical history.  Prior to Admission medications   Medication Sig Start Date End Date Taking? Authorizing Provider  drospirenone-ethinyl estradiol (YASMIN) 3-0.03 MG tablet Take 1 tablet by mouth daily. 07/24/20  Yes Lynnda Child, MD  omeprazole (PRILOSEC) 20 MG capsule Take 20 mg by mouth daily.  12/05/18  Yes [provider]  triamcinolone (KENALOG) 0.025 % cream Apply 1 application topically daily as needed.    Yes [provider]    Allergies  Allergen Reactions   Penicillins Rash    As an infant As an infant     Gynecologic History: Patient's last menstrual period was 07/24/2020.  Obstetric History: No obstetric history on file.  Social History   Socioeconomic History   Marital status: Single    Spouse name: Not on file   Number of children: Not on file   Years of education: Not on file   Highest education level: Not on file  Occupational History   Not on file  Tobacco Use   Smoking status: Never   Smokeless tobacco: Never  Vaping Use   Vaping Use: Never used  Substance and Sexual Activity   Alcohol use: Yes    Comment: on the weekends   Drug use: Never   Sexual activity: Not Currently  Other Topics Concern   Not on file  Social History Narrative   Not on file   Social Determinants of Health   Financial Resource Strain: Not on file  Food Insecurity: Not on file  Transportation Needs: Not on file  Physical Activity: Not on file  Stress: Not on file  Social Connections: Not on file  Intimate Partner Violence: Not on file    Family History  Problem Relation Age of Onset   Thyroid disease Father    Thyroid disease Paternal Aunt    Thyroid disease Maternal Grandmother    Hypertension Maternal Grandmother    Hypertension Maternal Grandfather    Heart disease Maternal Grandfather    Heart attack Maternal Grandfather    Heart  attack Paternal Grandfather    Hypertension Paternal Grandfather    Hashimoto's thyroiditis Cousin     Review of Systems  Constitutional:  Negative for chills and fever.  HENT:  Negative for congestion and sore throat.   Eyes:  Negative for blurred vision and double vision.  Respiratory:  Negative for shortness of breath.   Cardiovascular:  Negative for chest pain.  Gastrointestinal:  Negative for heartburn, nausea and vomiting.  Genitourinary: Negative.   Musculoskeletal: Negative.  Negative for myalgias.  Skin:  Positive for rash.  Neurological:  Negative for dizziness and headaches.  Endo/Heme/Allergies:  Does not bruise/bleed easily.  Psychiatric/Behavioral:  Negative for depression. The patient is not nervous/anxious.     Physical Exam BP 90/72   Pulse 82   Temp (!) 97.4 F (36.3 C) (Temporal)   Ht 5' 7.75" (1.721 m)   Wt 132 lb 8 oz (60.1 kg)   LMP 07/24/2020   SpO2 98%   BMI 20.30 kg/m    BP Readings from Last 3 Encounters:  08/13/20 90/72  09/11/19 100/60  08/17/19 122/78    Wt Readings from Last 3 Encounters:  08/13/20 132 lb 8 oz (60.1 kg)  09/11/19 133 lb 8 oz (60.6 kg)  08/17/19 132 lb 4 oz (60 kg)     Physical Exam Constitutional:      General: She is not in acute distress.    Appearance: She is well-developed. She is not diaphoretic.  HENT:     Head: Normocephalic and atraumatic.     Right Ear: External ear normal.     Left Ear: External ear normal.     Nose: Nose normal.  Eyes:     General: No scleral icterus.    Extraocular Movements: Extraocular movements intact.     Conjunctiva/sclera: Conjunctivae normal.  Cardiovascular:     Rate and Rhythm: Normal rate and regular rhythm.     Heart sounds: No murmur heard. Pulmonary:     Effort: Pulmonary effort is normal. No respiratory distress.     Breath sounds: Normal breath sounds. No wheezing.  Abdominal:     General: Bowel sounds are normal. There is no distension.     Palpations: Abdomen  is soft. There is no mass.     Tenderness: There is no abdominal tenderness. There is no guarding or rebound.  Musculoskeletal:        General: Normal range of motion.     Cervical back: Neck supple.  Lymphadenopathy:     Cervical: No cervical adenopathy.  Skin:    General: Skin is warm and dry.     Capillary Refill: Capillary refill takes less than 2 seconds.     Comments: Raised erythematous plaque on the right knee with some whitish coloration  Neurological:     Mental Status: She  is alert and oriented to person, place, and time.     Deep Tendon Reflexes: Reflexes normal.  Psychiatric:        Mood and Affect: Mood normal.        Behavior: Behavior normal.      Results: Depression screen [facility] 2/9 08/13/2020 03/13/2019  Decreased Interest 0 2  Down, Depressed, Hopeless 0 2  PHQ - 2 Score 0 4  Altered sleeping 1 3  Tired, decreased energy 0 3  Change in appetite 0 1  Feeling bad or failure about yourself  0 0  Trouble concentrating 0 0  Moving slowly or fidgety/restless 0 0  Suicidal thoughts 0 0  PHQ-9 Score 1 11  Difficult doing work/chores Not difficult at all Somewhat difficult      Assessment: 25 y.o. No obstetric history on file. female here for routine annual examination.  Plan: Problem List Items Addressed This Visit       Musculoskeletal and Integument   Eczema    Advised longer course of triamcinolone and to call if not resolving w/in 3 weeks. Discussed it could also be psoriasis but no family hx and not as thickened as expected       Relevant Medications   triamcinolone (KENALOG) 0.025 % cream   Other Visit Diagnoses     Annual physical exam    -  Primary   Family history of thyroid disease       Relevant Orders   TSH   Encounter for special screening examination for cardiovascular disorder       Relevant Orders   Lipid panel   Need for hepatitis C screening test       Relevant Orders   Hepatitis C antibody   Encounter for screening for HIV        Relevant Orders   HIV Antibody (routine testing w rflx)   Uses birth control       Relevant Medications   drospirenone-ethinyl estradiol (YASMIN) 3-0.03 MG tablet        Screening: -- Blood pressure screen normal -- cholesterol screening: will obtain -- Weight screening: normal -- Diabetes Screening: not due for screening -- Nutrition: encouraged healthy diet   Psych -- Depression screening (PHQ-9):  Flowsheet Row Office Visit from 08/13/2020 in [facility]  PHQ-9 Total Score 1        Safety -- tobacco screening: not using -- alcohol screening:  low-risk usage. -- no evidence of domestic violence or intimate partner violence. -- STD screening: gonorrhea/chlamydia NAAT (Recommended for <24 years) not collected per patient request.  Cancer Screening -- pap smear not collected per patient request - no hx of sexual activity and HPV vaccines received -- family history of breast cancer screening: done. not at high risk.   Immunizations Immunization History  Administered Date(s) Administered   Hepatitis A 10/04/2009, 10/09/2010   Hepatitis B 07/12/1996, 08/17/1996, 01/26/1997   Hpv-Unspecified 10/09/2010, 12/10/2010, 04/30/2011   Influenza,inj,Quad PF,6+ Mos 12/06/2017, 12/14/2018   Meningococcal B Recombinant 09/11/2016, 09/09/2018   Meningococcal Conjugate 02/02/2014   Moderna Sars-Covid-2 Vaccination 06/15/2019, 07/21/2019   PPD Test 08/15/2019   Tdap 12/06/2017    -- flu vaccine not in season -- TDAP q10 years up to date -- HPV vaccination series (Age <26, shared decision 27-45): received -- Covid-19 Vaccine up to date  Encouraged regular vision and dental screening. Encouraged healthy exercise and diet.   Lynnda Child

## 2020-08-13 NOTE — Assessment & Plan Note (Addendum)
Advised longer course of triamcinolone and to call if not resolving w/in 3 weeks. Discussed it could also be psoriasis but no family hx and not as thickened as expected

## 2020-08-14 LAB — HEPATITIS C ANTIBODY
Hepatitis C Ab: NONREACTIVE
SIGNAL TO CUT-OFF: 0 (ref <1.00)

## 2020-08-14 LAB — HIV ANTIBODY (ROUTINE TESTING W REFLEX): HIV 1&2 Ab, 4th Generation: NONREACTIVE

## 2020-08-27 ENCOUNTER — Encounter: Payer: Self-pay | Admitting: Family Medicine

## 2020-09-10 ENCOUNTER — Other Ambulatory Visit: Payer: Self-pay

## 2020-09-10 ENCOUNTER — Ambulatory Visit: Payer: BC Managed Care – PPO | Admitting: Family Medicine

## 2020-09-10 ENCOUNTER — Encounter: Payer: Self-pay | Admitting: Family Medicine

## 2020-09-10 VITALS — BP 110/72 | HR 81 | Temp 97.8°F | Ht 67.75 in | Wt 135.0 lb

## 2020-09-10 DIAGNOSIS — E782 Mixed hyperlipidemia: Secondary | ICD-10-CM

## 2020-09-10 DIAGNOSIS — K219 Gastro-esophageal reflux disease without esophagitis: Secondary | ICD-10-CM | POA: Diagnosis not present

## 2020-09-10 DIAGNOSIS — R229 Localized swelling, mass and lump, unspecified: Secondary | ICD-10-CM | POA: Diagnosis not present

## 2020-09-10 MED ORDER — OMEPRAZOLE 20 MG PO CPDR: 20.0000 mg | DELAYED_RELEASE_CAPSULE | Freq: Every day | ORAL | 3 refills | Status: DC

## 2020-09-10 NOTE — Assessment & Plan Note (Signed)
Cont omeprazole 20 mg. Discussed decreasing to prn use. If no improvement or worsening may want to consider GI consult. Avoid triggers.

## 2020-09-10 NOTE — Assessment & Plan Note (Signed)
FmHx of HLD in father. Encouraged regular exercise and diet handout provided. Will plan for fasting labs in the future.

## 2020-09-10 NOTE — Assessment & Plan Note (Signed)
Etiology unclear - fatty tissue vs local edema?. Discussed monitoring for another 2 months. If no resolution will plan for [place] to evaluate further. Discussed likely benign

## 2020-09-10 NOTE — Patient Instructions (Signed)
Swelling on back - watch and wait - may just a be a small fluid collection - if not resolving let me know and can plan to get an ultrasound  Preventing High Cholesterol Cholesterol is a white, waxy substance similar to fat that the human body needs to help build cells. The liver makes all the cholesterol that a person's body needs. Having high cholesterol (hypercholesterolemia) increases your risk for heart disease and stroke. Extra or excess cholesterolcomes from the food that you eat. High cholesterol can often be prevented with diet and lifestyle changes. If you already have high cholesterol, you can control it with diet, lifestyle changes,and medicines. How can high cholesterol affect me? If you have high cholesterol, fatty deposits (plaques) may build up on the walls of your blood vessels. The blood vessels that carry blood away from your heart are called arteries. Plaques make the arteries narrower and stiffer. This in turn can: Restrict or block blood flow and cause blood clots to form. Increase your risk for heart attack and stroke. What can increase my risk for high cholesterol? This condition is more likely to develop in people who: Eat foods that are high in saturated fat or cholesterol. Saturated fat is mostly found in foods that come from animal sources. Are overweight. Are not getting enough exercise. Have a family history of high cholesterol (familial hypercholesterolemia). What actions can I take to prevent this? Nutrition  Eat less saturated fat. Avoid trans fats (partially hydrogenated oils). These are often found in margarine and in some baked goods, fried foods, and snacks bought in packages. Avoid precooked or cured meat, such as bacon, sausages, or meat loaves. Avoid foods and drinks that have added sugars. Eat more fruits, vegetables, and whole grains. Choose healthy sources of protein, such as fish, poultry, lean cuts of red meat, beans, peas, lentils, and  nuts. Choose healthy sources of fat, such as: Nuts. Vegetable oils, especially olive oil. Fish that have healthy fats, such as omega-3 fatty acids. These fish include mackerel or salmon.  Lifestyle Lose weight if you are overweight. Maintaining a healthy body mass index (BMI) can help prevent or control high cholesterol. It can also lower your risk for diabetes and high blood pressure. Ask your health care provider to help you with a diet and exercise plan to lose weight safely. Do not use any products that contain nicotine or tobacco, such as cigarettes, e-cigarettes, and chewing tobacco. If you need help quitting, ask your health care provider. Alcohol use Do not drink alcohol if: Your health care provider tells you not to drink. You are pregnant, may be pregnant, or are planning to become pregnant. If you drink alcohol: Limit how much you use to: 0-1 drink a day for women. 0-2 drinks a day for men. Be aware of how much alcohol is in your drink. In the [place] one drink equals one 12 oz bottle of beer (355 mL), one 5 oz glass of wine (148 mL), or one 1 oz glass of hard liquor (44 mL). Activity  Get enough exercise. Do exercises as told by your health care provider. Each week, do at least 150 minutes of exercise that takes a medium level of effort (moderate-intensity exercise). This kind of exercise: Makes your heart beat faster while allowing you to still be able to talk. Can be done in short sessions several times a day or longer sessions a few times a week. For example, on 5 days each week, you could walk fast or ride  your bike 3 times a day for 10 minutes each time.  Medicines Your health care provider may recommend medicines to help lower cholesterol. This may be a medicine to lower the amount of cholesterol that your liver makes. You may need medicine if: Diet and lifestyle changes have not lowered your cholesterol enough. You have high cholesterol and other risk factors for heart  disease or stroke. Take over-the-counter and prescription medicines only as told by your health care provider. General information Manage your risk factors for high cholesterol. Talk with your health care provider about all your risk factors and how to lower your risk. Manage other conditions that you have, such as diabetes or high blood pressure (hypertension). Have blood tests to check your cholesterol levels at regular points in time as told by your health care provider. Keep all follow-up visits as told by your health care provider. This is important. Where to find more information American Heart Association: www.heart.org National Heart, Lung, and Blood Institute: PopSte[employer].is Summary High cholesterol increases your risk for heart disease and stroke. By keeping your cholesterol level low, you can reduce your risk for these conditions. High cholesterol can often be prevented with diet and lifestyle changes. Work with your health care provider to manage your risk factors, and have your blood tested regularly. This information is not intended to replace advice given to you by your health care provider. Make sure you discuss any questions you have with your healthcare provider. Document Revised: 11/22/2018 Document Reviewed: 11/22/2018 Elsevier Patient Education  2022 [employer].

## 2020-09-10 NOTE — Progress Notes (Signed)
Subjective:     Gabrielle Cantrell is a 25 y.o. female presenting for Back Pain     Back Pain Pertinent negatives include no fever.   #Back swelling - started 2 weeks ago - noticed it when she was putting lotion on - was at the beach a week earlier w/o noticing it - has not increased in size that she has noticed - no redness or skin changes - no pain on the lesion - did have some back pain last week with period cramps   Review of Systems  Constitutional:  Negative for chills and fever.  Musculoskeletal:  Positive for back pain. Negative for joint swelling and myalgias.    Social History   Tobacco Use  Smoking Status Never  Smokeless Tobacco Never        Objective:    BP Readings from Last 3 Encounters:  09/10/20 110/72  08/13/20 90/72  09/11/19 100/60   Wt Readings from Last 3 Encounters:  09/10/20 135 lb (61.2 kg)  08/13/20 132 lb 8 oz (60.1 kg)  09/11/19 133 lb 8 oz (60.6 kg)    BP 110/72   Pulse 81   Temp 97.8 F (36.6 C) (Temporal)   Ht 5' 7.75" (1.721 m)   Wt 135 lb (61.2 kg)   SpO2 98%   BMI 20.68 kg/m    Physical Exam Constitutional:      General: She is not in acute distress.    Appearance: She is well-developed. She is not diaphoretic.  HENT:     Right Ear: External ear normal.     Left Ear: External ear normal.  Eyes:     Conjunctiva/sclera: Conjunctivae normal.  Cardiovascular:     Rate and Rhythm: Normal rate and regular rhythm.     Heart sounds: No murmur heard. Pulmonary:     Effort: Pulmonary effort is normal. No respiratory distress.     Breath sounds: Normal breath sounds. No wheezing.  Musculoskeletal:     Cervical back: Neck supple.  Skin:    General: Skin is warm and dry.     Capillary Refill: Capillary refill takes less than 2 seconds.     Comments: Mid lower back with area of soft tissue swelling. Soft, poorly circumscribed area. No erythema. No ttp.   Neurological:     Mental Status: She is alert. Mental status is at  baseline.  Psychiatric:        Mood and Affect: Mood normal.        Behavior: Behavior normal.          Assessment & Plan:   Problem List Items Addressed This Visit       Digestive   Acid reflux    Cont omeprazole 20 mg. Discussed decreasing to prn use. If no improvement or worsening may want to consider GI consult. Avoid triggers.        Relevant Medications   omeprazole (PRILOSEC) 20 MG capsule     Other   Mixed hyperlipidemia    FmHx of HLD in father. Encouraged regular exercise and diet handout provided. Will plan for fasting labs in the future.        Soft tissue swelling - Primary    Etiology unclear - fatty tissue vs local edema?. Discussed monitoring for another 2 months. If no resolution will plan for [place] to evaluate further. Discussed likely benign         Return if symptoms worsen or fail to improve.  Lynnda Child, MD  This visit occurred during the SARS-CoV-2 public health emergency.  Safety protocols were in place, including screening questions prior to the visit, additional usage of staff PPE, and extensive cleaning of exam room while observing appropriate contact time as indicated for disinfecting solutions.

## 2021-07-10 ENCOUNTER — Other Ambulatory Visit: Payer: Self-pay | Admitting: Family Medicine

## 2021-07-10 ENCOUNTER — Encounter: Payer: Self-pay | Admitting: Family Medicine

## 2021-07-10 DIAGNOSIS — Z789 Other specified health status: Secondary | ICD-10-CM

## 2021-07-10 MED ORDER — DROSPIRENONE-ETHINYL ESTRADIOL 3-0.03 MG PO TABS: 1.0000 | ORAL_TABLET | Freq: Every day | ORAL | 0 refills | Status: DC

## 2021-07-10 NOTE — Telephone Encounter (Signed)
Pt scheduled CPE at next available physical slot which is 09/12/21. Can medication be filled until then?

## 2021-09-12 ENCOUNTER — Ambulatory Visit (INDEPENDENT_AMBULATORY_CARE_PROVIDER_SITE_OTHER): Payer: BC Managed Care – PPO | Admitting: Family Medicine

## 2021-09-12 ENCOUNTER — Other Ambulatory Visit (HOSPITAL_COMMUNITY)
Admission: RE | Admit: 2021-09-12 | Discharge: 2021-09-12 | Disposition: A | Discharge status: Home / Self Care | Payer: BC Managed Care – PPO | Source: Ambulatory Visit | Attending: Family Medicine | Admitting: Family Medicine

## 2021-09-12 VITALS — BP 90/60 | HR 114 | Temp 97.3°F | Ht 68.0 in | Wt 130.1 lb

## 2021-09-12 DIAGNOSIS — Z789 Other specified health status: Secondary | ICD-10-CM

## 2021-09-12 DIAGNOSIS — Z124 Encounter for screening for malignant neoplasm of cervix: Secondary | ICD-10-CM

## 2021-09-12 DIAGNOSIS — L309 Dermatitis, unspecified: Secondary | ICD-10-CM

## 2021-09-12 DIAGNOSIS — K219 Gastro-esophageal reflux disease without esophagitis: Secondary | ICD-10-CM | POA: Diagnosis not present

## 2021-09-12 DIAGNOSIS — M25532 Pain in left wrist: Secondary | ICD-10-CM

## 2021-09-12 DIAGNOSIS — Z0000 Encounter for general adult medical examination without abnormal findings: Secondary | ICD-10-CM | POA: Diagnosis not present

## 2021-09-12 DIAGNOSIS — M79601 Pain in right arm: Secondary | ICD-10-CM

## 2021-09-12 DIAGNOSIS — F5104 Psychophysiologic insomnia: Secondary | ICD-10-CM

## 2021-09-12 DIAGNOSIS — G4709 Other insomnia: Secondary | ICD-10-CM

## 2021-09-12 MED ORDER — OMEPRAZOLE 20 MG PO CPDR: 20.0000 mg | DELAYED_RELEASE_CAPSULE | Freq: Every day | ORAL | 3 refills | Status: AC

## 2021-09-12 MED ORDER — TRAZODONE HCL 50 MG PO TABS: 25.0000 mg | ORAL_TABLET | Freq: Every evening | ORAL | 0 refills | Status: AC | PRN

## 2021-09-12 MED ORDER — DROSPIRENONE-ETHINYL ESTRADIOL 3-0.03 MG PO TABS: 1.0000 | ORAL_TABLET | Freq: Every day | ORAL | 3 refills | Status: AC

## 2021-09-12 MED ORDER — TRIAMCINOLONE ACETONIDE 0.025 % EX CREA: 1.0000 | TOPICAL_CREAM | Freq: Every day | CUTANEOUS | 1 refills | Status: AC | PRN
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
## 2021-09-12 NOTE — Patient Instructions (Addendum)
Physical therapy - check with insurance and locally to see where you would like to go - call or Mychart with location   #Sleep - trazodone - if no improvement make a follow-up appointment  Sleep hygiene checklist: 1. Avoid naps during the day 2. Avoid stimulants such as caffeine and nicotine. Avoid bedtime alcohol (it can speed onset of sleep but the body's metabolism can cause awakenings). At least 2 hours before bedtime 3. All forms of exercise help ensure sound sleep - limit vigorous exercise to morning or late afternoon 4. Avoid food too close to bedtime including chocolate (which contains caffeine) 5. Soak up natural light 6. Establish regular bedtime routine. 7. Associate bed with sleep - avoid TV, computer or phone, reading while in bed. 8. Ensure pleasant, relaxing sleep environment - quiet, dark, cool room.  Good Sleep Hygiene Habits -- Got to bed and wake up within an hour of the same time every day -- Avoid bright screens (from laptop, phone, TV) within at least 30 minutes before bed. The "blue light" supresses the sleep hormone melatonin and the content may stimulate as well -- Maintain a quiet and dark sleep environment (blackout curtains, turn on a fan or white noise to block out disruptive sounds) -- Practicing relaxing activites before bed (taking a shower, reading a book, journaling, meditation app) -- To quiet a busy mind -- consider journaling before bed (jotting down reminders, worry thoughts, as well as positive things like a gratitude list)   Begin a Mindfulness/Meditation practice -- this can take a little as 3 minutes -- You can find resources in books -- Or you can download apps like  ---- Headspace App (which currently has free content called "Weathering the Storm") ---- Calm (which has a few free options)  ---- Insignt Timer ---- Stop, Breathe & Think  # With each of these Apps - you should decline the "start free trial" offer and as you search through the  App should be able to access some of their free content. You can also chose to pay for the content if you find one that works well for you.   # Many of them also offer sleep specific content which may help with insomnia

## 2021-09-12 NOTE — Assessment & Plan Note (Signed)
Recurrent issue, trial of trazodone.  Patient will follow-up if ineffective.

## 2021-09-15 LAB — CYTOLOGY - PAP
Comment: NEGATIVE
Diagnosis: NEGATIVE
High risk HPV: NEGATIVE

## 2021-09-20 ENCOUNTER — Other Ambulatory Visit: Payer: Self-pay | Admitting: Family Medicine

## 2021-09-20 DIAGNOSIS — G4709 Other insomnia: Secondary | ICD-10-CM

## 2021-10-13 ENCOUNTER — Telehealth: Payer: Self-pay | Admitting: Family Medicine

## 2021-10-13 NOTE — Telephone Encounter (Signed)
Noted will plan to assess after ER

## 2021-10-13 NOTE — Telephone Encounter (Signed)
Patient called back and needed to be seen in the next 24 hours. Patient wasn't able to make an appointment in that time frame. Patient decided to go to the ER.

## 2021-10-13 NOTE — Telephone Encounter (Signed)
I spoke with Gabrielle Cantrell at front office phones and pt was offered an appt at [facility] but did not fit pts schedule. Pt advised Gabrielle Cantrell that she would go to ED. Sending note to Dr Milinda Antis and [place] CMA.

## 2021-10-13 NOTE — Telephone Encounter (Signed)
[facility] Day - Client TELEPHONE ADVICE RECORD [facility] Patient Name: ADRIELLE POLAKOWSKI GS Gender: Female DOB: [birth year] Age: 26 Y 2 M 27 D Return Phone Number: [phone] (Primary) Address: City/ State/ Zip: [place] [address] Client [facility] Day - Client Client Site [facility] - Day Provider Gweneth Dimitri- MD Contact Type Call Who Is Calling Patient / Member / Family / Caregiver Call Type Triage / Clinical Relationship To Patient Self Return Phone Number [phone] (Primary) Chief Complaint CHEST PAIN - pain, pressure, heaviness or tightness Reason for Call Symptomatic / Request for Health Information Initial Comment Caller states she has been cough and having chest pains for 3 days. Translation No Nurse Assessment Nurse: Scarlette Ar, RN, Heather Date/Time (Eastern Time): 10/13/2021 2:15:06 PM Confirm and document reason for call. If symptomatic, describe symptoms. ---Caller states she has been cough and having chest pains for 3 days. The cough started about 3 weeks ago and the chest pain started today. Does the patient have any new or worsening symptoms? ---Yes Will a triage be completed? ---Yes Related visit to physician within the last 2 weeks? ---No Does the PT have any chronic conditions? (i.e. diabetes, asthma, this includes High risk factors for pregnancy, etc.) ---No Is the patient pregnant or possibly pregnant? (Ask all females between the ages of 8-55) ---No Is this a behavioral health or substance abuse call? ---No Guidelines Guideline Title Affirmed Question Affirmed Notes Nurse Date/Time (Eastern Time) Cough - Acute NonProductive SEVERE coughing spells (e.g., whooping sound after coughing, vomiting after coughing) Standifer, RN, Heather 10/13/2021 2:15:41 PM Disp. Time Lamount Cohen Time) Disposition Final User 10/13/2021 2:11:52 PM Send to Urgent Samara Snide,  Shearon Stalls PLEASE NOTE: All timestamps contained within this report are represented as [place] Standard Time. CONFIDENTIALTY NOTICE: This fax transmission is intended only for the addressee. It contains information that is legally privileged, confidential or otherwise protected from use or disclosure. If you are not the intended recipient, you are strictly prohibited from reviewing, disclosing, copying using or disseminating any of this information or taking any action in reliance on or regarding this information. If you have received this fax in error, please notify us immediately by telephone so that we can arrange for its return to [place]. Phone: [phone], Toll-Free: [phone], Fax: [phone] Page: 2 of 2 Call Id: [id number] Disp. Time Lamount Cohen Time) Disposition Final User 10/13/2021 2:21:03 PM See PCP within 24 Hours Yes Standifer, RN, Herbert Seta Final Disposition 10/13/2021 2:21:03 PM See PCP within 24 Hours Yes Standifer, RN, Herbert Seta Caller Disagree/Comply Comply Caller Understands Yes PreDisposition Call Doctor Care Advice Given Per Guideline SEE PCP WITHIN 24 HOURS: * IF OFFICE WILL BE OPEN: You need to be examined within the next 24 hours. Call your doctor (or NP/PA) when the office opens and make an appointment. COUGH SYRUP WITH DEXTROMETHORPHAN: * Cough syrups containing the cough suppressant dextromethorphan may help decrease your cough. * Examples: Delsym 12-hour Cough, Robitussin Cough Long-Acting, Triaminic Long-Acting, Vicks DayQuil Cough. * Cough syrup works best for coughs that keep you awake at night. It can also sometimes help in the late stages of a lung or airway infection when the cough is dry and hacking. Cough syrup can be used along with cough drops. HUMIDIFIER: * If the air is dry, use a humidifier in the bedroom. CALL BACK IF: * Difficulty breathing occurs * You become worse CARE ADVICE given per Cough - Acute Non-Productive (Adult) guideline. Referrals REFERRED TO  PCP OFFIC

## 2021-10-13 NOTE — Telephone Encounter (Signed)
Patient called and stated she has been coughing for 3 days and having chest pains. Patient has been sent to access nurse.

## 2021-10-14 NOTE — Telephone Encounter (Signed)
Went to [facility] in [place], had a negative chest x-ray. Started her on prednisone for possible infection.

## 2021-10-14 NOTE — Telephone Encounter (Signed)
Please call and check in on patient. In other phone encounter plan was for ER. Unable to see anything in the chart.

## 2021-10-27 IMAGING — US US THYROID
1 series · 14 of 25 positions shown · non-contrast
Comparison: Concurrently obtained but separately dictated CT scan
of the soft tissues of the neck.

CLINICAL DATA: Palpable abnormality. 23-year-old female with upper
neck swelling

EXAM:
THYROID ULTRASOUND
TECHNIQUE: Ultrasound examination of the thyroid gland and adjacent soft
tissues was performed.

[Series 1: us thyroid · 14 of 43 slices shown]
[im 1/43]
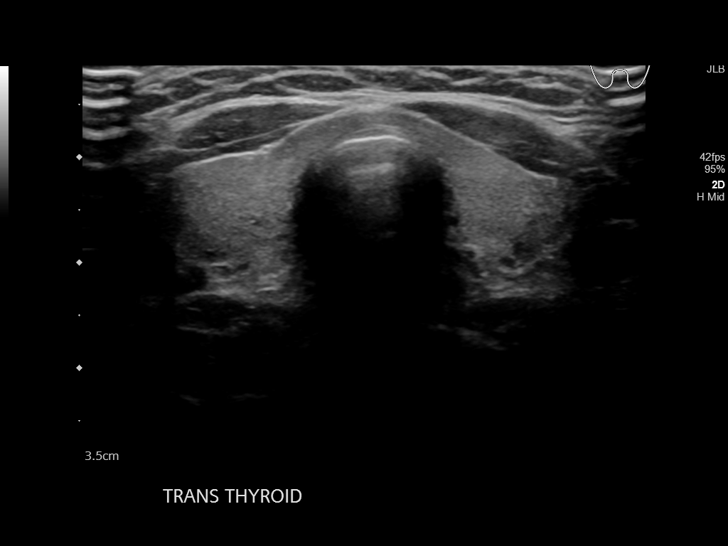
[im 4/43]
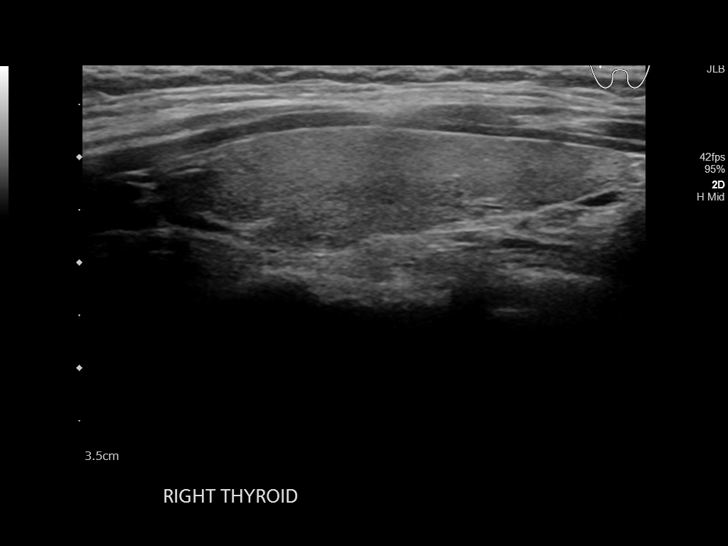
[im 8/43]
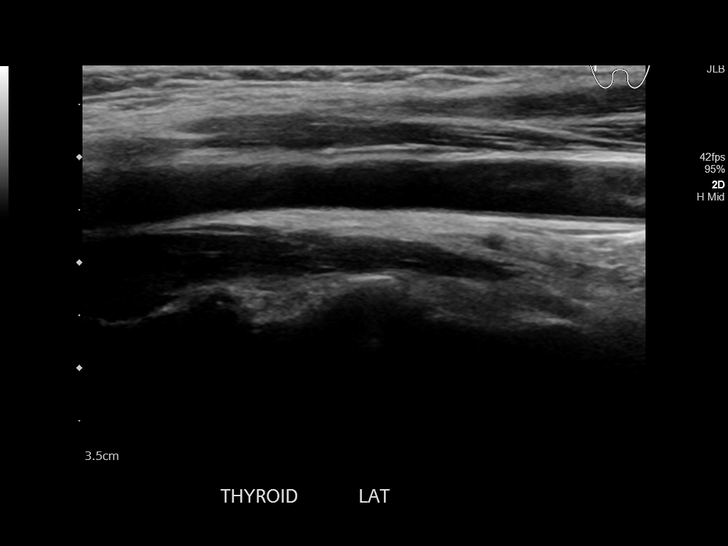
[im 11/43]
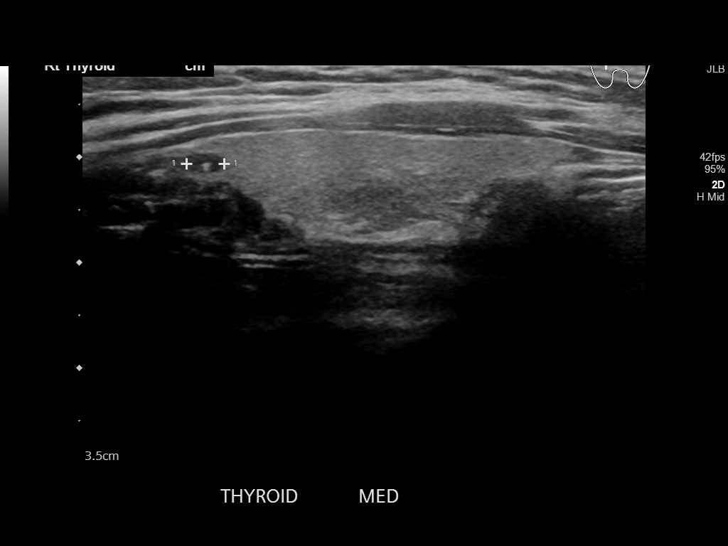
[im 15/43]
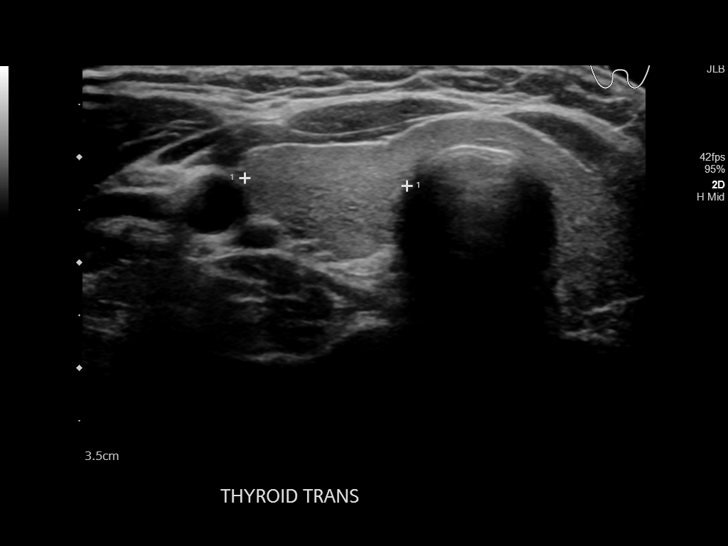
[im 16/43]
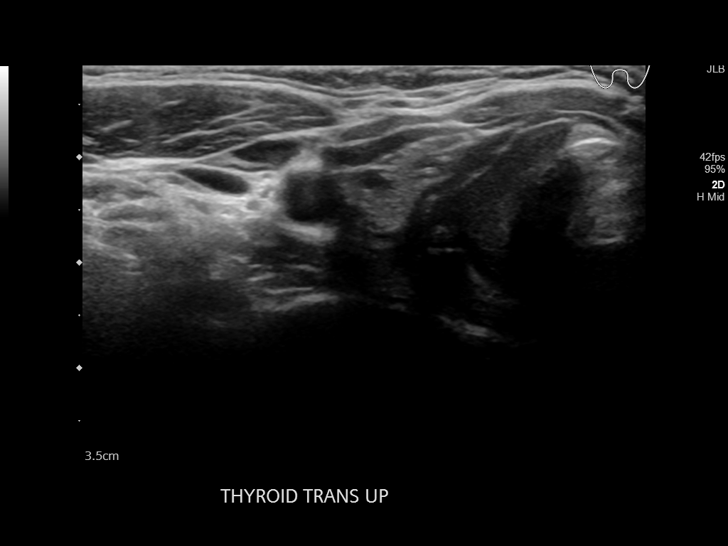
[im 20/43]
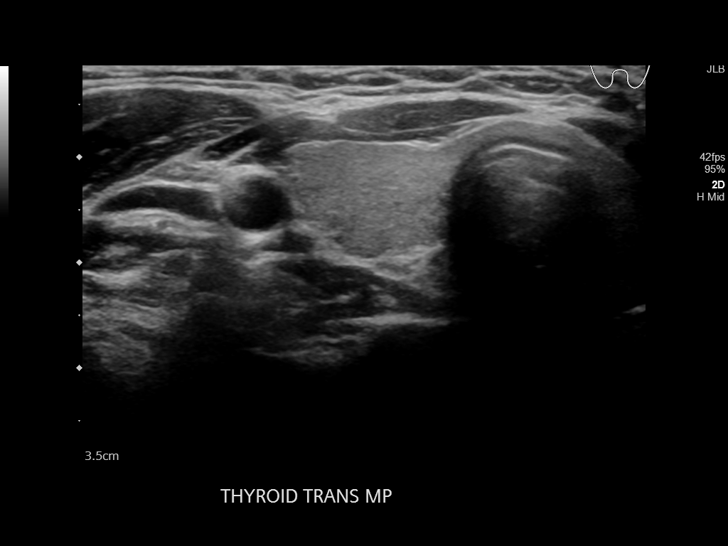
[im 23/43]
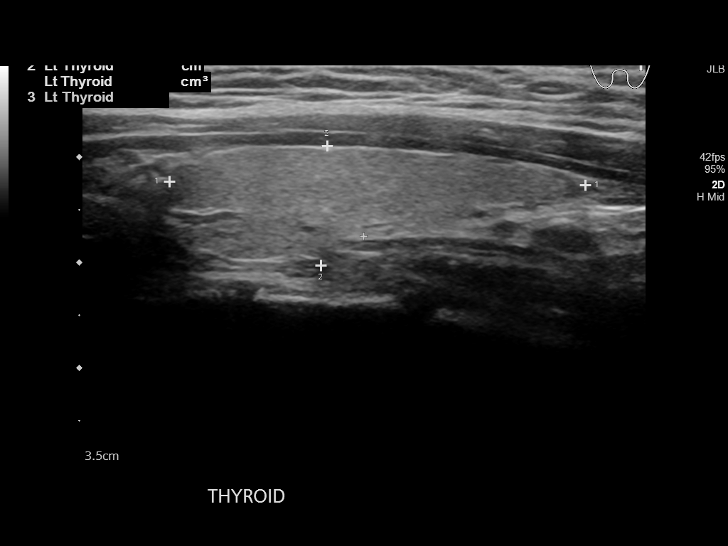
[im 27/43]
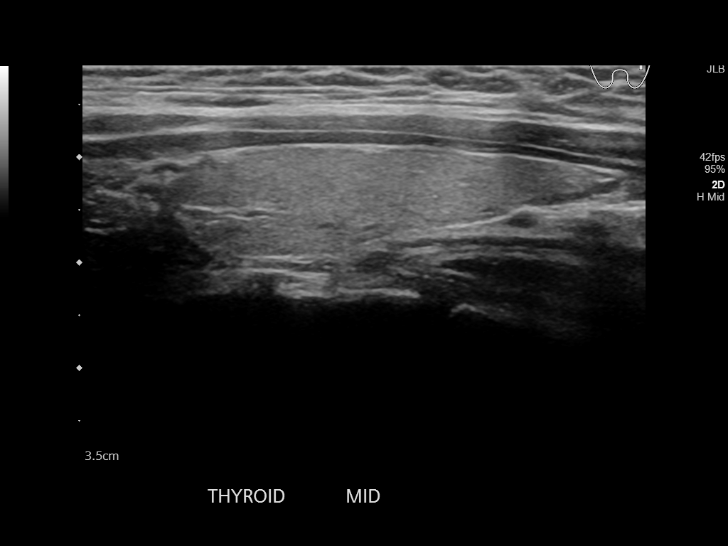
[im 29/43]
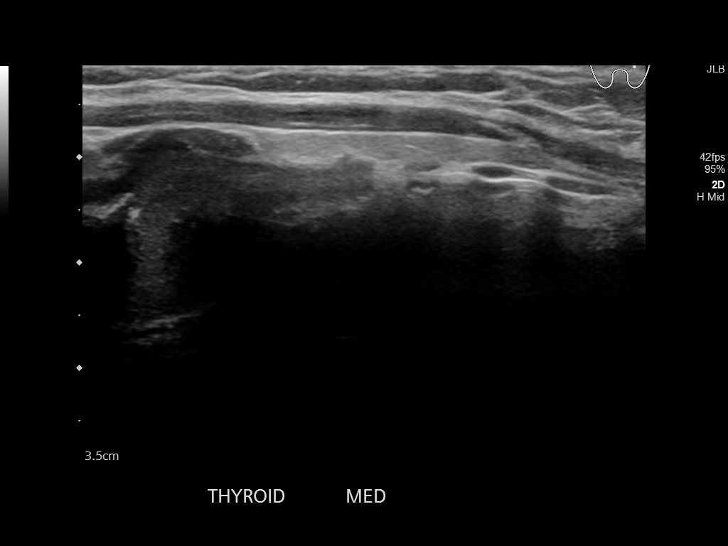
[im 32/43]
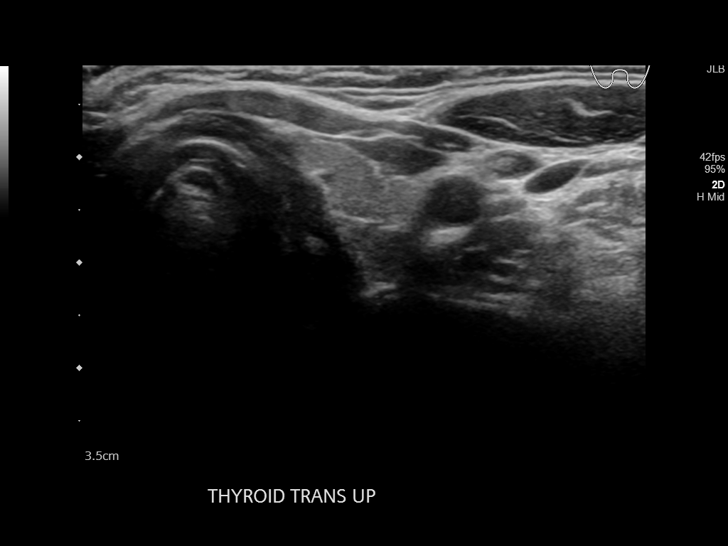
[im 36/43]
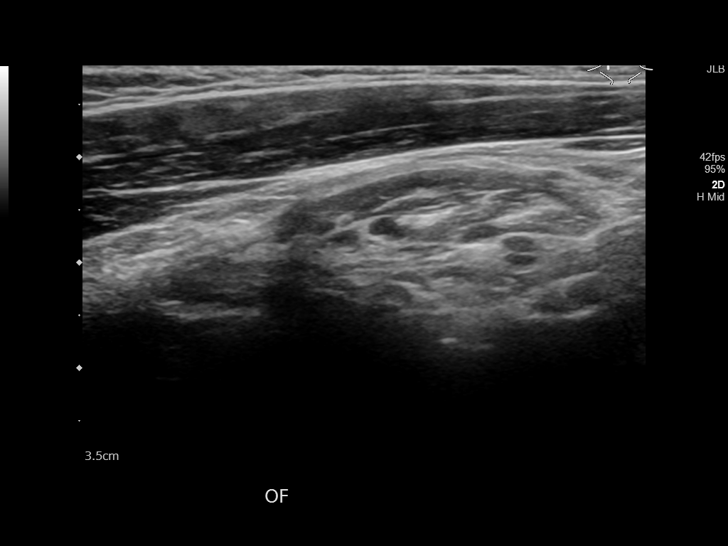
[im 39/43]
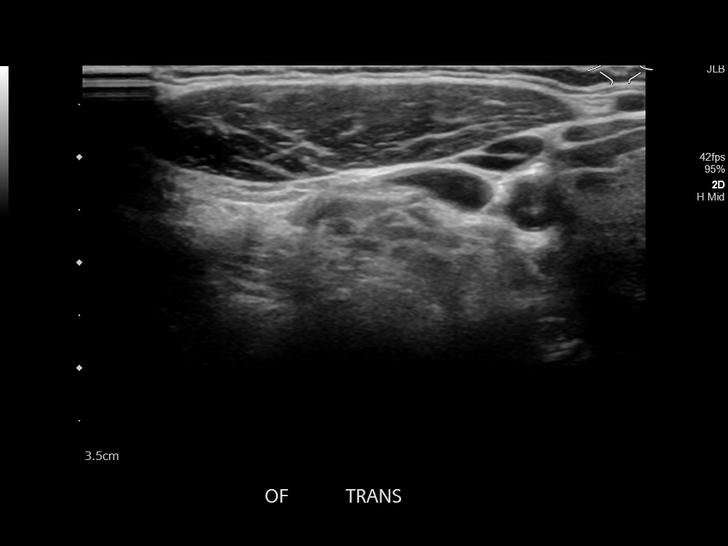
[im 43/43]
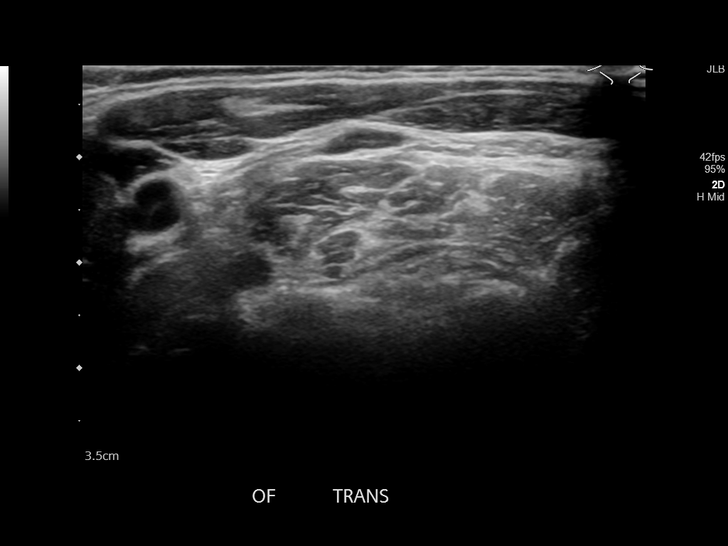

[14 of 25 positions shown; findings below may reference images not displayed]

FINDINGS: Parenchymal Echotexture: Normal

Isthmus: 0.17 cm

Right lobe: 4.0 x 1.1 x 1.5 cm

Left lobe: 3.9 x 1.1 x 1.4 cm

_________________________________________________________

Estimated total number of nodules >/= 1 cm: 0

Number of spongiform nodules >/=  2 cm not described below (TR1): 0

Number of mixed cystic and solid nodules >/= 1.5 cm not described
below (TR2): 0

_________________________________________________________

No discrete nodules are seen within the thyroid gland.
IMPRESSION: Normal sonographic appearance of the thyroid gland.

## 2021-10-28 ENCOUNTER — Encounter: Payer: Self-pay | Admitting: Family Medicine

## 2021-10-30 ENCOUNTER — Ambulatory Visit: Payer: BC Managed Care – PPO | Admitting: Family Medicine

## 2021-10-30 VITALS — BP 120/70 | HR 85 | Temp 97.9°F | Ht 68.0 in | Wt 133.1 lb

## 2021-10-30 DIAGNOSIS — S29011D Strain of muscle and tendon of front wall of thorax, subsequent encounter: Secondary | ICD-10-CM | POA: Insufficient documentation

## 2021-10-30 DIAGNOSIS — R091 Pleurisy: Secondary | ICD-10-CM

## 2021-10-30 MED ORDER — CYCLOBENZAPRINE HCL 10 MG PO TABS: 5.0000 mg | ORAL_TABLET | Freq: Three times a day (TID) | ORAL | 0 refills | Status: AC | PRN

## 2021-10-30 MED ORDER — IBUPROFEN 800 MG PO TABS: 800.0000 mg | ORAL_TABLET | Freq: Three times a day (TID) | ORAL | 0 refills | Status: AC | PRN

## 2021-10-30 NOTE — Assessment & Plan Note (Signed)
Encouraged her to use ibuprofen 800 mg 3 times daily for pain and inflammation to treat any remaining viral pleurisy.  Discussed length of time likely several months for resolution of symptoms.

## 2021-10-30 NOTE — Assessment & Plan Note (Addendum)
Acute, most likely strain of musculature between ribs from repeated coughing. No suggestion of rib fracture given pain nonfocal.  Course of prednisone treated for persistent cough and she no longer has symptoms.  No specific sign of pulmonary infection.  All symptoms likely secondary to initial viral infection.

## 2021-10-30 NOTE — Progress Notes (Signed)
Patient ID: Gabrielle Cantrell, female    DOB: May 19, 1995, 26 y.o.   MRN: [id number]  This visit was conducted in person.  BP 120/70   Pulse 85   Temp 97.9 F (36.6 C) (Temporal)   Ht 5\' 8"  (1.727 m)   Wt 133 lb 2 oz (60.4 kg)   LMP 10/30/2021 (Exact Date)   SpO2 100%   BMI 20.24 kg/m    CC:  Chief Complaint  Patient presents with   Cough    X 5 weeks. UC ruled out pneumonia. Pt has completed a 6 day prednisone course.    Chest Pain    On sides, under and below of both breasts    Subjective:   HPI: Gabrielle Cantrell is a 26 y.o. female  patient of Dr. 22 with history of  GERD, GAD presenting on 10/30/2021 for Cough (X 5 weeks. UC ruled out pneumonia. Pt has completed a 6 day prednisone course. ) and Chest Pain (On sides, under and below of both breasts)  Noted persistent cough ongoing for > 5 weeks. Reviewed office visit note from urgent care from October 13, 2021 Diagnosed with pleuritic chest pain.   Chest x-ray showed no focal consolidation, pleural effusion or pneumothorax, heart size normal. Treated with Medrol Dosepak 4 mg tablet as well as benzonatate.   Symptoms started with   hacking cough, nonproductive.  No congestion, no ST, no ear pin, no sinus pressure. NO fever.  In end of August, she started with side pain on left  side, noted with deep breaths and cough.   Now pain hurts in chest with movement and  pain is now  bilateral chest under breast across ribs. Pain worse at night  Occ hoarse voice, Rare tickle cough.  No fever.   No rash.   Feels well otherwise. No flu like symptoms.     She has been using 400 mg every 6-8 hours...helps temporarily.  Relevant past medical, surgical, family and social history reviewed and updated as indicated. Interim medical history since our last visit reviewed. Allergies and medications reviewed and updated. Outpatient Medications Prior to Visit  Medication Sig Dispense Refill   drospirenone-ethinyl estradiol (YASMIN) 3-0.03 MG  tablet Take 1 tablet by mouth daily. 84 tablet 3   omeprazole (PRILOSEC) 20 MG capsule Take 1 capsule (20 mg total) by mouth daily. 90 capsule 3   traZODone (DESYREL) 50 MG tablet Take 0.5-2 tablets (25-100 mg total) by mouth at bedtime as needed for sleep. 30 tablet 0   triamcinolone (KENALOG) 0.025 % cream Apply 1 Application topically daily as needed. 30 g 1   No facility-administered medications prior to visit.     Per HPI unless specifically indicated in ROS section below Review of Systems  Constitutional:  Negative for fatigue and fever.  HENT:  Negative for congestion.   Eyes:  Negative for pain.  Respiratory:  Positive for cough. Negative for shortness of breath.   Cardiovascular:  Negative for chest pain, palpitations and leg swelling.  Gastrointestinal:  Negative for abdominal pain.  Genitourinary:  Negative for dysuria and vaginal bleeding.  Musculoskeletal:  Negative for back pain.  Neurological:  Negative for syncope, light-headedness and headaches.  Psychiatric/Behavioral:  Negative for dysphoric mood.    Objective:  BP 120/70   Pulse 85   Temp 97.9 F (36.6 C) (Temporal)   Ht 5\' 8"  (1.727 m)   Wt 133 lb 2 oz (60.4 kg)   LMP 10/30/2021 (Exact Date)  SpO2 100%   BMI 20.24 kg/m   Wt Readings from Last 3 Encounters:  10/30/21 133 lb 2 oz (60.4 kg)  09/12/21 130 lb 2 oz (59 kg)  09/10/20 135 lb (61.2 kg)      Physical Exam Constitutional:      General: She is not in acute distress.    Appearance: Normal appearance. She is well-developed. She is not ill-appearing or toxic-appearing.  HENT:     Head: Normocephalic.     Right Ear: Hearing, tympanic membrane, ear canal and external ear normal. Tympanic membrane is not erythematous, retracted or bulging.     Left Ear: Hearing, tympanic membrane, ear canal and external ear normal. Tympanic membrane is not erythematous, retracted or bulging.     Nose: No mucosal edema or rhinorrhea.     Right Sinus: No maxillary  sinus tenderness or frontal sinus tenderness.     Left Sinus: No maxillary sinus tenderness or frontal sinus tenderness.     Mouth/Throat:     Pharynx: Uvula midline.  Eyes:     General: Lids are normal. Lids are everted, no foreign bodies appreciated.     Conjunctiva/sclera: Conjunctivae normal.     Pupils: Pupils are equal, round, and reactive to light.  Neck:     Thyroid: No thyroid mass or thyromegaly.     Vascular: No carotid bruit.     Trachea: Trachea normal.  Cardiovascular:     Rate and Rhythm: Normal rate and regular rhythm.     Pulses: Normal pulses.     Heart sounds: Normal heart sounds, S1 normal and S2 normal. No murmur heard.    No friction rub. No gallop.  Pulmonary:     Effort: Pulmonary effort is normal. No tachypnea or respiratory distress.     Breath sounds: Normal breath sounds. No decreased breath sounds, wheezing, rhonchi or rales.  Chest:     Chest wall: Tenderness present. No mass, lacerations, deformity, swelling, crepitus or edema. There is no dullness to percussion.       Comments: Nonfocal tenderness to palpation over anterior rib cage Abdominal:     General: Bowel sounds are normal.     Palpations: Abdomen is soft.     Tenderness: There is no abdominal tenderness.  Musculoskeletal:     Cervical back: Normal range of motion and neck supple.  Skin:    General: Skin is warm and dry.     Findings: No rash.  Neurological:     Mental Status: She is alert.  Psychiatric:        Mood and Affect: Mood is not anxious or depressed.        Speech: Speech normal.        Behavior: Behavior normal. Behavior is cooperative.        Thought Content: Thought content normal.        Judgment: Judgment normal.       Results for orders placed or performed in visit on 09/12/21  Cytology - PAP  Result Value Ref Range   High risk HPV Negative    Adequacy      Satisfactory for evaluation; transformation zone component PRESENT.   Diagnosis      - Negative for  intraepithelial lesion or malignancy (NILM)   Comment Normal Reference Range HPV - Negative      COVID 19 screen:  No recent travel or known exposure to COVID19 The patient denies respiratory symptoms of COVID 19 at this time. The importance of social distancing  was discussed today.   Assessment and Plan Problem List Items Addressed This Visit     Strain of chest wall, subsequent encounter - Primary    Acute, most likely strain of musculature between ribs from repeated coughing. No suggestion of rib fracture given pain nonfocal.  Course of prednisone treated for persistent cough and she no longer has symptoms.  No specific sign of pulmonary infection.  All symptoms likely secondary to initial viral infection.      Viral pleurisy    Encouraged her to use ibuprofen 800 mg 3 times daily for pain and inflammation to treat any remaining viral pleurisy.  Discussed length of time likely several months for resolution of symptoms.          Kerby Nora, MD
